# Patient Record
Sex: Male | Born: 1970 | Race: Black or African American | Hispanic: No | Marital: Single | Smoking: Current every day smoker
Health system: Southern US, Community
[De-identification: ages and names within clinical notes are randomized; demographics above are authoritative.]

---

## 2019-09-10 ENCOUNTER — Emergency Department (HOSPITAL_COMMUNITY): Payer: No Typology Code available for payment source

## 2019-09-10 ENCOUNTER — Emergency Department (HOSPITAL_COMMUNITY)
Admission: EM | Admit: 2019-09-10 | Discharge: 2019-09-11 | Disposition: A | Discharge status: Home / Self Care | Payer: No Typology Code available for payment source | Attending: Emergency Medicine | Admitting: Emergency Medicine

## 2019-09-10 ENCOUNTER — Other Ambulatory Visit: Payer: Self-pay

## 2019-09-10 ENCOUNTER — Encounter (HOSPITAL_COMMUNITY): Payer: Self-pay | Admitting: Emergency Medicine

## 2019-09-10 DIAGNOSIS — Y999 Unspecified external cause status: Secondary | ICD-10-CM | POA: Diagnosis not present

## 2019-09-10 DIAGNOSIS — M25551 Pain in right hip: Secondary | ICD-10-CM | POA: Diagnosis not present

## 2019-09-10 DIAGNOSIS — Y92410 Unspecified street and highway as the place of occurrence of the external cause: Secondary | ICD-10-CM | POA: Diagnosis not present

## 2019-09-10 DIAGNOSIS — Z041 Encounter for examination and observation following transport accident: Secondary | ICD-10-CM | POA: Diagnosis not present

## 2019-09-10 DIAGNOSIS — M545 Low back pain: Secondary | ICD-10-CM | POA: Insufficient documentation

## 2019-09-10 DIAGNOSIS — M542 Cervicalgia: Secondary | ICD-10-CM | POA: Insufficient documentation

## 2019-09-10 DIAGNOSIS — F17210 Nicotine dependence, cigarettes, uncomplicated: Secondary | ICD-10-CM | POA: Diagnosis not present

## 2019-09-10 DIAGNOSIS — Y9389 Activity, other specified: Secondary | ICD-10-CM | POA: Insufficient documentation

## 2019-09-10 DIAGNOSIS — S39012A Strain of muscle, fascia and tendon of lower back, initial encounter: Secondary | ICD-10-CM

## 2019-09-10 DIAGNOSIS — S76011A Strain of muscle, fascia and tendon of right hip, initial encounter: Secondary | ICD-10-CM

## 2019-09-10 MED ORDER — TRAMADOL HCL 50 MG PO TABS
50.0000 mg | ORAL_TABLET | Freq: Once | ORAL | Status: AC
  Administered 2019-09-10: 50 mg via ORAL
  Filled 2019-09-10: qty 1

## 2019-09-10 NOTE — ED Notes (Signed)
Patient transported to CT and to Xray
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
## 2019-09-11 MED ORDER — TRAMADOL HCL 50 MG PO TABS: 50.0000 mg | ORAL_TABLET | Freq: Four times a day (QID) | ORAL | 0 refills | Status: AC | PRN
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
## 2019-09-11 NOTE — Discharge Instructions (Addendum)
You likely strained your back and hip. Your CT and xrays were unremarkable   Take motrin for pain   Take tramadol for severe pain   See your doctor  Return to ER if you have worse back pain, hip pain, chest pain
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
